# Patient Record
Sex: Female | Born: 1945 | Race: White | Hispanic: No
Health system: Southern US, Community
[De-identification: ages and names within clinical notes are randomized; demographics above are authoritative.]

## PROBLEM LIST (undated history)

## (undated) DIAGNOSIS — F419 Anxiety disorder, unspecified: Secondary | ICD-10-CM

## (undated) DIAGNOSIS — E7800 Pure hypercholesterolemia, unspecified: Secondary | ICD-10-CM

## (undated) DIAGNOSIS — K219 Gastro-esophageal reflux disease without esophagitis: Secondary | ICD-10-CM

---

## 2012-12-06 ENCOUNTER — Emergency Department (HOSPITAL_COMMUNITY): Payer: Medicare Other

## 2012-12-06 ENCOUNTER — Encounter (HOSPITAL_COMMUNITY): Payer: Self-pay | Admitting: Radiology

## 2012-12-06 ENCOUNTER — Emergency Department (HOSPITAL_COMMUNITY)
Admission: EM | Admit: 2012-12-06 | Discharge: 2012-12-07 | Disposition: A | Discharge status: Home / Self Care | Payer: Medicare Other | Attending: Emergency Medicine | Admitting: Emergency Medicine

## 2012-12-06 DIAGNOSIS — Z7982 Long term (current) use of aspirin: Secondary | ICD-10-CM | POA: Insufficient documentation

## 2012-12-06 DIAGNOSIS — R21 Rash and other nonspecific skin eruption: Secondary | ICD-10-CM | POA: Insufficient documentation

## 2012-12-06 DIAGNOSIS — R1110 Vomiting, unspecified: Secondary | ICD-10-CM

## 2012-12-06 DIAGNOSIS — R109 Unspecified abdominal pain: Secondary | ICD-10-CM

## 2012-12-06 DIAGNOSIS — R1084 Generalized abdominal pain: Secondary | ICD-10-CM | POA: Insufficient documentation

## 2012-12-06 DIAGNOSIS — R112 Nausea with vomiting, unspecified: Secondary | ICD-10-CM | POA: Insufficient documentation

## 2012-12-06 DIAGNOSIS — Z79899 Other long term (current) drug therapy: Secondary | ICD-10-CM | POA: Insufficient documentation

## 2012-12-06 LAB — URINALYSIS, ROUTINE W REFLEX MICROSCOPIC
Bilirubin Urine: NEGATIVE
Glucose, UA: NEGATIVE mg/dL
Hgb urine dipstick: NEGATIVE
Ketones, ur: NEGATIVE mg/dL
Nitrite: NEGATIVE
Protein, ur: NEGATIVE mg/dL
Specific Gravity, Urine: 1.018 (ref 1.005–1.030)
Urobilinogen, UA: 0.2 mg/dL (ref 0.0–1.0)
pH: 5 (ref 5.0–8.0)

## 2012-12-06 LAB — COMPREHENSIVE METABOLIC PANEL
AST: 65 U/L — ABNORMAL HIGH (ref 0–37)
Albumin: 4 g/dL (ref 3.5–5.2)
Alkaline Phosphatase: 83 U/L (ref 39–117)
BUN: 18 mg/dL (ref 6–23)
Calcium: 9.3 mg/dL (ref 8.4–10.5)
Chloride: 103 mEq/L (ref 96–112)
GFR calc non Af Amer: 86 mL/min — ABNORMAL LOW (ref >90)
Glucose, Bld: 97 mg/dL (ref 70–99)
Potassium: 3.9 mEq/L (ref 3.5–5.1)
Total Bilirubin: 0.3 mg/dL (ref 0.3–1.2)
Total Protein: 7.1 g/dL (ref 6.0–8.3)

## 2012-12-06 LAB — POCT I-STAT TROPONIN I: Troponin i, poc: 0 ng/mL (ref 0.00–0.08)

## 2012-12-06 LAB — CBC WITH DIFFERENTIAL/PLATELET
Basophils Absolute: 0 10*3/uL (ref 0.0–0.1)
Basophils Relative: 0 % (ref 0–1)
Eosinophils Absolute: 0 10*3/uL (ref 0.0–0.7)
HCT: 39.1 % (ref 36.0–46.0)
Hemoglobin: 13.7 g/dL (ref 12.0–15.0)
Lymphs Abs: 0.6 10*3/uL — ABNORMAL LOW (ref 0.7–4.0)
MCH: 30.4 pg (ref 26.0–34.0)
MCV: 86.9 fL (ref 78.0–100.0)
Neutro Abs: 11.2 10*3/uL — ABNORMAL HIGH (ref 1.7–7.7)
Neutrophils Relative %: 93 % — ABNORMAL HIGH (ref 43–77)
Platelets: 328 10*3/uL (ref 150–400)
RBC: 4.5 MIL/uL (ref 3.87–5.11)
RDW: 12.4 % (ref 11.5–15.5)
WBC: 12.1 10*3/uL — ABNORMAL HIGH (ref 4.0–10.5)

## 2012-12-06 LAB — URINE MICROSCOPIC-ADD ON

## 2012-12-06 LAB — CG4 I-STAT (LACTIC ACID): Lactic Acid, Venous: 2.08 mmol/L (ref 0.5–2.2)

## 2012-12-06 LAB — LIPASE, BLOOD: Lipase: 32 U/L (ref 11–59)

## 2012-12-06 MED ORDER — SODIUM CHLORIDE 0.9 % IV BOLUS (SEPSIS)
1000.0000 mL | Freq: Once | INTRAVENOUS | Status: AC
  Administered 2012-12-06: 1000 mL via INTRAVENOUS

## 2012-12-06 MED ORDER — IOHEXOL 300 MG/ML  SOLN
100.0000 mL | Freq: Once | INTRAMUSCULAR | Status: AC | PRN
  Administered 2012-12-06: 100 mL via INTRAVENOUS

## 2012-12-06 MED ORDER — DIPHENHYDRAMINE HCL 50 MG/ML IJ SOLN
12.5000 mg | Freq: Once | INTRAMUSCULAR | Status: DC
  Filled 2012-12-06: qty 1

## 2012-12-06 MED ORDER — METOCLOPRAMIDE HCL 5 MG/ML IJ SOLN
5.0000 mg | Freq: Once | INTRAMUSCULAR | Status: DC
  Filled 2012-12-06: qty 2

## 2012-12-06 MED ORDER — ONDANSETRON HCL 4 MG/2ML IJ SOLN
4.0000 mg | Freq: Once | INTRAMUSCULAR | Status: AC
  Administered 2012-12-06: 4 mg via INTRAVENOUS
  Filled 2012-12-06: qty 2

## 2012-12-06 MED ORDER — IOHEXOL 300 MG/ML  SOLN
25.0000 mL | INTRAMUSCULAR | Status: AC
  Administered 2012-12-06: 25 mL via ORAL

## 2012-12-06 MED ORDER — FENTANYL CITRATE 0.05 MG/ML IJ SOLN
50.0000 ug | Freq: Once | INTRAMUSCULAR | Status: AC
  Administered 2012-12-06: 50 ug via INTRAVENOUS
  Filled 2012-12-06: qty 2

## 2012-12-06 NOTE — ED Notes (Signed)
Family at bedside. 

## 2012-12-06 NOTE — ED Provider Notes (Signed)
CSN: [id number]     Arrival date & time 12/06/12  2115 History   First MD Initiated Contact with Patient 12/06/12 2208     Chief Complaint  Patient presents with  . Allergic Reaction   (Consider location/radiation/quality/duration/timing/severity/associated sxs/prior Treatment) HPI Onset was today, sudden, constant.  Pt reports seeing PCP earlier today and had a flu shot and cortisone shot. Later in the day she developed nausea, emesis, abdominal pain, rash to face. She is concerned she is having allergic reaction. The pain is moderate, located to abdomen, described as sharp pain. Modifying factors: pain is worse with emesis.  Associated symptoms: no SOB, no cough, mouth feels dry.  Recent medical care: no prior treatments today.   History reviewed. No pertinent past medical history. No past surgical history on file. No family history on file. History  Substance Use Topics  . Smoking status: Not on file  . Smokeless tobacco: Not on file  . Alcohol Use: Not on file   OB History   Grav Para Term Preterm Abortions TAB SAB Ect Mult Living                 Review of Systems Constitutional: Negative for fever.  Eyes: Negative for vision loss.  ENT: Negative for difficulty swallowing.  Cardiovascular: Negative for chest pain. Respiratory: Negative for respiratory distress.  Gastrointestinal:  Positive for vomiting.  Genitourinary: Negative for inability to void.  Musculoskeletal: Negative for gait problem.  Integumentary: Negative for rash.  Neurological: Negative for new focal weakness.     Allergies  Sulfa antibiotics; Influenza vaccines; and Morphine and related  Home Medications   Current Outpatient Rx  Name  Route  Sig  Dispense  Refill  . aspirin 81 MG chewable tablet   Oral   Chew 81 mg by mouth every Monday, Wednesday, and Friday.         . clonazePAM (KLONOPIN) 0.5 MG tablet   Oral   Take 0.5 mg by mouth 2 (two) times daily as needed for anxiety.         Marland Kitchen  omeprazole (PRILOSEC) 40 MG capsule   Oral   Take 40 mg by mouth daily.         . pravastatin (PRAVACHOL) 80 MG tablet   Oral   Take 80 mg by mouth daily.         . Vitamin D, Ergocalciferol, (DRISDOL) 50000 UNITS CAPS capsule   Oral   Take 50,000 Units by mouth every Tuesday.         . cephALEXin (KEFLEX) 500 MG capsule   Oral   Take 1 capsule (500 mg total) by mouth 4 (four) times daily.   20 capsule   0   . ondansetron (ZOFRAN ODT) 4 MG disintegrating tablet   Oral   Take 1 tablet (4 mg total) by mouth every 8 (eight) hours as needed for nausea or vomiting.   20 tablet   0    BP 140/63  Pulse 96  Temp(Src) 98.9 F (37.2 C) (Oral)  Resp 19  SpO2 95% Physical Exam Nursing note and vitals reviewed.  Constitutional: Pt is alert and appears stated age. Eyes: No injection, no scleral icterus. HENT: Atraumatic, airway open without erythema or exudate.  Respiratory: No respiratory distress. Equal breathing bilaterally. Cardiovascular: Normal rate. Extremities warm and well perfused.  Abdomen: Soft, non-distended, tender diffusely.  MSK: Extremities are atraumatic without deformity. Skin: Erythematous petechial rash to face, no wounds.   Neuro: No motor nor sensory  deficit.     ED Course  Procedures (including critical care time) Labs Review Labs Reviewed  CBC WITH DIFFERENTIAL - Abnormal; Notable for the following:    WBC 12.1 (*)    Neutrophils Relative % 93 (*)    Neutro Abs 11.2 (*)    Lymphocytes Relative 5 (*)    Lymphs Abs 0.6 (*)    Monocytes Relative 2 (*)    All other components within normal limits  COMPREHENSIVE METABOLIC PANEL - Abnormal; Notable for the following:    AST 65 (*)    ALT 52 (*)    GFR calc non Af Amer 86 (*)    All other components within normal limits  URINALYSIS, ROUTINE W REFLEX MICROSCOPIC - Abnormal; Notable for the following:    APPearance CLOUDY (*)    Leukocytes, UA LARGE (*)    All other components within normal  limits  URINE CULTURE  LIPASE, BLOOD  URINE MICROSCOPIC-ADD ON  CG4 I-STAT (LACTIC ACID)  POCT I-STAT TROPONIN I   Imaging Review Ct Abdomen Pelvis W Contrast  12/07/2012   CLINICAL DATA:  Abdominal pain.  EXAM: CT ABDOMEN AND PELVIS WITH CONTRAST  TECHNIQUE: Multidetector CT imaging of the abdomen and pelvis was performed using the standard protocol following bolus administration of intravenous contrast.  CONTRAST:  OMNIPAQUE IOHEXOL 300 MG/ML  SOLN  COMPARISON:  None.  FINDINGS: Minimal diffuse low density of the liver relative to the spleen. Normal appearing spleen, pancreas, gallbladder, adrenal glands, kidneys, urinary bladder, uterus and ovaries. Scattered colonic diverticula without evidence of diverticulitis. No evidence of appendicitis. Small sliding hiatal hernia. Minimal dependent atelectasis at both lung bases. Mild lumbar and lower thoracic spine degenerative changes. Atheromatous arterial calcifications.  IMPRESSION: 1. No acute abnormality. 2. Small hiatal hernia. 3. Minimal hepatic steatosis.   Electronically Signed   By: Gordan Payment M.D.   On: 12/07/2012 00:40   Dg Chest Portable 1 View  12/06/2012   CLINICAL DATA:  Shortness of breath.  Vomiting.  EXAM: PORTABLE CHEST - 1 VIEW  COMPARISON:  None.  FINDINGS: Mildly enlarged cardiac silhouette. Clear lungs. Mildly prominent pulmonary vasculature and interstitial markings with mild diffuse peribronchial thickening. Mild scoliosis.  IMPRESSION: Borderline cardiomegaly, mild pulmonary vascular congestion and mild bronchitic changes.   Electronically Signed   By: Gordan Payment M.D.   On: 12/06/2012 22:53    EKG Interpretation     Ventricular Rate:  96 PR Interval:  191 QRS Duration: 95 QT Interval:  331 QTC Calculation: 418 R Axis:   48 Text Interpretation:  Sinus rhythm Normal ECG            MDM   1. Rash of face   2. Abdominal pain   3. Emesis    67 y.o. female presents with abdominal pain, emesis, rash to  face. Not c/w anaphylaxis or allergic reaction. Facial rash c/w small capillary leak from retching. Pt tachycardic. Normal blood pressure. Afebrile. Abdominal pain on exam. EKG without STEMI. CXR without PNA. Lipase w/o pancreatitis. Lactic wnl. CBC with slight leukocytosis. UA with potential infection. Troponin neg. Pt given IV zofran, IV fentanyl and reports feeling better. CT scan ordered to consider acute surgical process and resulted with no acute abnormality. Plan for d/c home with close pcp f/u. Counseling provided regarding diagnosis, treatment plan, follow up recommendations, and return precautions. Questions answered.       I independently viewed, interpreted, and used in my medical decision making all ordered lab and imaging tests.  Medical Decision Making discussed with ED attending Dr. Bebe Shaggy.      Charm Barges, MD 12/07/12 1600

## 2012-12-06 NOTE — ED Notes (Signed)
CG-4 result of 2.08 was shown to resident at 22:22

## 2012-12-06 NOTE — ED Notes (Signed)
Pt states she got a flu shot and cortisone shot. Denies difficulty breathing. Rash on forehead, and face. Nausea, and vomiting. States her tongue feels thick.

## 2012-12-07 MED ORDER — ONDANSETRON 4 MG PO TBDP: 4.0000 mg | ORAL_TABLET | Freq: Three times a day (TID) | ORAL | Status: DC | PRN

## 2012-12-07 MED ORDER — CEPHALEXIN 500 MG PO CAPS: 500.0000 mg | ORAL_CAPSULE | Freq: Four times a day (QID) | ORAL | Status: DC

## 2012-12-07 MED ORDER — ONDANSETRON HCL 4 MG/2ML IJ SOLN
INTRAMUSCULAR | Status: AC
  Filled 2012-12-07: qty 2

## 2012-12-07 MED ORDER — ONDANSETRON HCL 4 MG/2ML IJ SOLN
4.0000 mg | Freq: Once | INTRAMUSCULAR | Status: AC
  Administered 2012-12-07: 4 mg via INTRAVENOUS

## 2012-12-09 LAB — URINE CULTURE

## 2012-12-09 NOTE — Progress Notes (Signed)
ED Antimicrobial Stewardship Positive Culture Follow Up   Natalie Shah is an 67 y.o. female who presented to [facility] on 12/06/2012 with a chief complaint of  Chief Complaint  Patient presents with  . Allergic Reaction    Recent Results (from the past 720 hour(s))  URINE CULTURE     Status: None   Collection Time    12/06/12 10:38 PM      Result Value Range Status   Specimen Description URINE, CLEAN CATCH   Final   Special Requests NONE   Final   Culture  Setup Time     Final   Value: 12/07/2012 04:51     Performed at [facility]   Culture     Final   Value: >=100,000 COLONIES/mL ENTEROCOCCUS SPECIES     Performed at [facility]   Report Status 12/09/2012 FINAL   Final   Organism ID, Bacteria ENTEROCOCCUS SPECIES   Final    [x]  Treated with Keflex, organism resistant to prescribed antimicrobial []  Patient discharged originally without antimicrobial agent and treatment is now indicated  New antibiotic prescription: Amoxicillin 500 mg po BID x 7 days  ED Provider: [name], PA   Elwin Sleight 12/09/2012, 12:00 PM Infectious Diseases Pharmacist Phone# [phone]

## 2012-12-10 ENCOUNTER — Telehealth (HOSPITAL_BASED_OUTPATIENT_CLINIC_OR_DEPARTMENT_OTHER): Payer: Self-pay | Admitting: Emergency Medicine

## 2012-12-10 NOTE — ED Notes (Signed)
Post ED Visit - Positive Culture Follow-up: Successful Patient Follow-Up  Culture assessed and recommendations reviewed by: []  Wes Dulaney, Pharm.D., BCPS []  Celedonio Miyamoto, Pharm.D., BCPS []  Georgina Pillion, [address].D., BCPS []  [place], [address].D., BCPS, AAHIVP []  Estella Husk, Pharm.D., BCPS, AAHIVP [x]  Okey Regal, Pharm.D., BCPS  Positive urine culture  []  Patient discharged without antimicrobial prescription and treatment is now indicated [x]  Organism is resistant to prescribed ED discharge antimicrobial []  Patient with positive blood cultures  Changes discussed with ED provider: [name] PA-C New antibiotic prescription: Amoxicillin 500 mg PO BID x 7 days    Natalie Shah 12/10/2012, 6:35 PM

## 2012-12-10 NOTE — ED Provider Notes (Signed)
I have personally seen and examined the patient.  I have discussed the plan of care with the resident.  I have reviewed the documentation on PMH/FH/Soc. History.  I have reviewed the documentation of the resident and agree.  I have reviewed and agree with the ECG interpretation(s) documented by the resident.  Pt stable in the ED, no distress noted   Joya Gaskins, MD 12/10/12 [id number]

## 2013-02-13 ENCOUNTER — Emergency Department (HOSPITAL_COMMUNITY)
Admission: EM | Admit: 2013-02-13 | Discharge: 2013-02-14 | Disposition: A | Discharge status: Home / Self Care | Payer: Medicare Other | Attending: Emergency Medicine | Admitting: Emergency Medicine

## 2013-02-13 ENCOUNTER — Encounter (HOSPITAL_COMMUNITY): Payer: Self-pay | Admitting: Emergency Medicine

## 2013-02-13 ENCOUNTER — Emergency Department (HOSPITAL_COMMUNITY): Payer: Medicare Other

## 2013-02-13 DIAGNOSIS — Z79899 Other long term (current) drug therapy: Secondary | ICD-10-CM | POA: Insufficient documentation

## 2013-02-13 DIAGNOSIS — J40 Bronchitis, not specified as acute or chronic: Secondary | ICD-10-CM

## 2013-02-13 DIAGNOSIS — Z792 Long term (current) use of antibiotics: Secondary | ICD-10-CM | POA: Insufficient documentation

## 2013-02-13 DIAGNOSIS — E7800 Pure hypercholesterolemia, unspecified: Secondary | ICD-10-CM | POA: Insufficient documentation

## 2013-02-13 DIAGNOSIS — F411 Generalized anxiety disorder: Secondary | ICD-10-CM | POA: Insufficient documentation

## 2013-02-13 DIAGNOSIS — R630 Anorexia: Secondary | ICD-10-CM | POA: Insufficient documentation

## 2013-02-13 DIAGNOSIS — K219 Gastro-esophageal reflux disease without esophagitis: Secondary | ICD-10-CM | POA: Insufficient documentation

## 2013-02-13 HISTORY — DX: Anxiety disorder, unspecified: F41.9

## 2013-02-13 HISTORY — DX: Pure hypercholesterolemia, unspecified: E78.00

## 2013-02-13 HISTORY — DX: Gastro-esophageal reflux disease without esophagitis: K21.9

## 2013-02-13 NOTE — ED Provider Notes (Signed)
CSN: [id number]     Arrival date & time 02/13/13  1554 History   First MD Initiated Contact with Patient 02/13/13 2301     Chief Complaint  Patient presents with  . Cough  . Shortness of Breath   (Consider location/radiation/quality/duration/timing/severity/associated sxs/prior Treatment) HPI Comments: 68 yo female with bronchitis, lipids hx presents with recurrent cough for 3 wks, productive yellow sputum, sick contacts with similar however her cough is lasting longer. No travel, recent hospitalization or recent surgery.  No leg swelling/ pain or blood clot hx.  Non smoker. No lung dz or heart dz.  No cp.    Patient is a 68 y.o. female presenting with cough and shortness of breath. The history is provided by the patient.  Cough Associated symptoms: shortness of breath   Associated symptoms: no chest pain, no chills, no fever, no headaches and no rash   Shortness of Breath Associated symptoms: cough   Associated symptoms: no abdominal pain, no chest pain, no fever, no headaches, no neck pain, no rash and no vomiting     Past Medical History  Diagnosis Date  . High cholesterol   . Anxiety   . Acid reflux    History reviewed. No pertinent past surgical history. History reviewed. No pertinent family history. History  Substance Use Topics  . Smoking status: Not on file  . Smokeless tobacco: Not on file  . Alcohol Use: No   OB History   Grav Para Term Preterm Abortions TAB SAB Ect Mult Living                 Review of Systems  Constitutional: Positive for appetite change. Negative for fever and chills.  HENT: Positive for congestion.   Respiratory: Positive for cough and shortness of breath.   Cardiovascular: Negative for chest pain.  Gastrointestinal: Negative for vomiting and abdominal pain.  Genitourinary: Negative for dysuria and flank pain.  Musculoskeletal: Negative for neck pain and neck stiffness.  Skin: Negative for rash.  Neurological: Negative for  light-headedness and headaches.    Allergies  Sulfa antibiotics; Influenza vaccines; and Morphine and related  Home Medications   Current Outpatient Rx  Name  Route  Sig  Dispense  Refill  . albuterol (PROVENTIL HFA;VENTOLIN HFA) 108 (90 BASE) MCG/ACT inhaler   Inhalation   Inhale 1-2 puffs into the lungs every 6 (six) hours as needed for wheezing or shortness of breath.         Marland Kitchen. albuterol (PROVENTIL) (2.5 MG/3ML) 0.083% nebulizer solution   Nebulization   Take 2.5 mg by nebulization every 6 (six) hours as needed for wheezing or shortness of breath.         Marland Kitchen. aspirin 81 MG chewable tablet   Oral   Chew 81 mg by mouth every Monday, Wednesday, and Friday.         . brompheniramine-pseudoephedrine-DM 30-2-10 MG/5ML syrup   Oral   Take 10 mLs by mouth every 4 (four) hours as needed (for cough).         . clonazePAM (KLONOPIN) 0.5 MG tablet   Oral   Take 0.5 mg by mouth 3 (three) times daily as needed for anxiety.          Marland Kitchen. levofloxacin (LEVAQUIN) 500 MG tablet   Oral   Take 500 mg by mouth daily.         Marland Kitchen. omeprazole (PRILOSEC) 40 MG capsule   Oral   Take 40 mg by mouth daily.         .Marland Kitchen  pravastatin (PRAVACHOL) 40 MG tablet   Oral   Take 40 mg by mouth daily.         . Pseudoeph-Doxylamine-DM-APAP (NYQUIL PO)   Oral   Take 10 mLs by mouth at bedtime as needed (for cough/sleep).         . Vitamin D, Ergocalciferol, (DRISDOL) 50000 UNITS CAPS capsule   Oral   Take 50,000 Units by mouth every Tuesday.         . vitamin E 400 UNIT capsule   Oral   Take 400 Units by mouth daily.          BP 148/84  Pulse 103  Temp(Src) 98.2 F (36.8 C) (Oral)  Resp 20  Wt 157 lb (71.215 kg)  SpO2 97% Physical Exam  Nursing note and vitals reviewed. Constitutional: She is oriented to person, place, and time. She appears well-developed and well-nourished.  HENT:  Head: Normocephalic and atraumatic.  Eyes: Conjunctivae are normal. Right eye exhibits no  discharge. Left eye exhibits no discharge.  Neck: Normal range of motion. Neck supple. No tracheal deviation present.  Cardiovascular: Normal rate and regular rhythm.   Pulmonary/Chest: Effort normal and breath sounds normal.  Recurrent coughing fits in ED during exam, mild congestion  Abdominal: Soft. She exhibits no distension. There is no tenderness. There is no guarding.  Musculoskeletal: She exhibits no edema and no tenderness.  Neurological: She is alert and oriented to person, place, and time.  Skin: Skin is warm. No rash noted.  Psychiatric: She has a normal mood and affect.    ED Course  Procedures (including critical care time) Labs Review Labs Reviewed - No data to display Imaging Review Dg Chest 2 View (if Patient Has Fever And/or Copd)  02/13/2013   CLINICAL DATA:  Bronchitis and shortness of breath.  EXAM: CHEST  2 VIEW  COMPARISON:  12/06/2012  FINDINGS: Two views of the chest demonstrate clear lungs. Heart and mediastinum are within normal limits. Bony thorax is intact with mild degenerative changes. The trachea is midline.  IMPRESSION: No active cardiopulmonary disease.   Electronically Signed   By: Richarda Overlie M.D.   On: 02/13/2013 17:18    EKG Interpretation    Date/Time:  Tuesday February 13 2013 16:13:29 EST Ventricular Rate:  87 PR Interval:  162 QRS Duration: 80 QT Interval:  348 QTC Calculation: 418 R Axis:   39 Text Interpretation:  Normal sinus rhythm Normal ECG Confirmed by Marcoantonio Legault  MD, Caydence Enck ([id number]) on 02/13/2013 11:51:01 PM            MDM   1. Bronchitis    Well appearing.  Vitals okay. Healthy patient, likely recurrent viral URI.  CXR reviewed, no cardiomegaly or infiltrate. Pt has already been on two abx I do not feel another is indicated.   EKG no acute findings, no cp or concern for cardiac clinically.  Results and differential diagnosis were discussed with the patient. Close follow up outpatient was discussed, patient comfortable with  the plan.        Enid Skeens, MD 02/13/13 [id number]

## 2013-02-13 NOTE — ED Notes (Signed)
Pt reports dry cough and fever for 2/12 weeks. Thinks she may have bronchitis. Also reports sinus drainage and right ear pain.

## 2013-02-13 NOTE — ED Notes (Addendum)
Pt reports having a cough and sob for several weeks, has been to pcp and diagnosed with bronchitis, started on steroids and antibiotics. Went to ucc several days ago and given cough meds and levofloxacin, which pt thinks she is allergic to. Having productive cough with white/yellow sputum. Cough noted at triage but airway is intact, speaking in full sentences.

## 2013-02-13 NOTE — Discharge Instructions (Signed)
If you were given medicines take as directed.  If you are on coumadin or contraceptives realize their levels and effectiveness is altered by many different medicines.  If you have any reaction (rash, tongues swelling, other) to the medicines stop taking and see a physician.   °Please follow up as directed and return to the ER or see a physician for new or worsening symptoms.  Thank you. ° ° °

## 2013-02-14 NOTE — ED Notes (Signed)
Patient to followup with PCP in the am

## 2014-11-13 IMAGING — CT CT ABD-PELV W/ CM
2 of 5 series · 17 of 46 positions shown, 19 images · IV contrast (CONTRAST)
Comparison: None.

CLINICAL DATA: Abdominal pain.

EXAM:
CT ABDOMEN AND PELVIS WITH CONTRAST
TECHNIQUE: Multidetector CT imaging of the abdomen and pelvis was performed
using the standard protocol following bolus administration of
intravenous contrast.
CONTRAST:  100mL OMNIPAQUE IOHEXOL 300 MG/ML  SOLN

[Series 2: routine · axial · 0.68mm/px · z∈[-400,-10]mm · 14 of 88 slices shown, 16 images]
[im 5/88  soft-tissue]
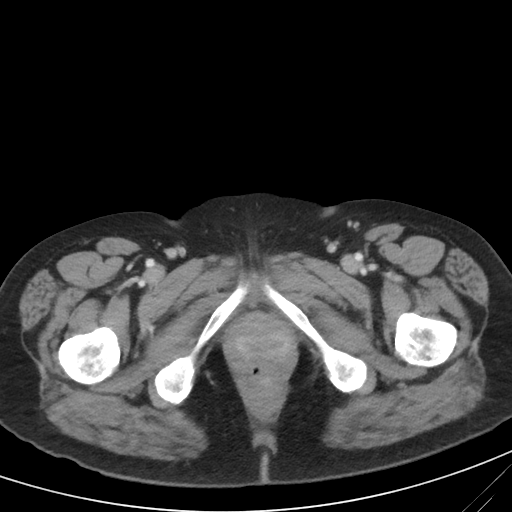
[im 5/88  bone]
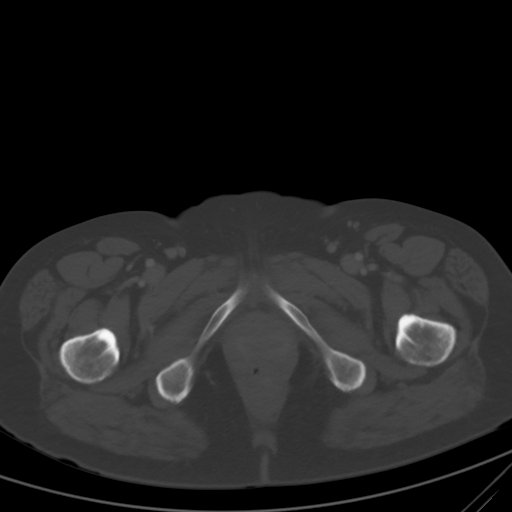
[im 10/88  soft-tissue]
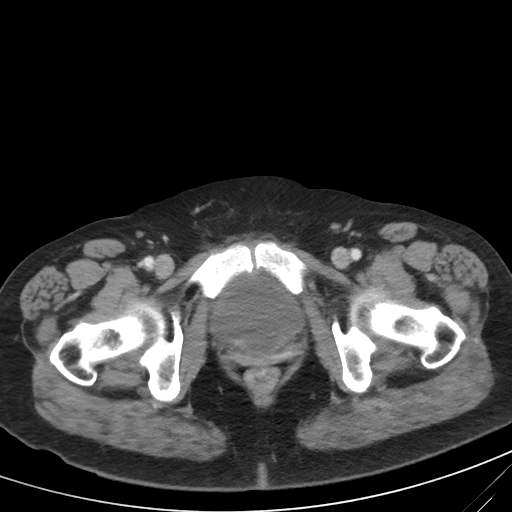
[im 20/88  soft-tissue]
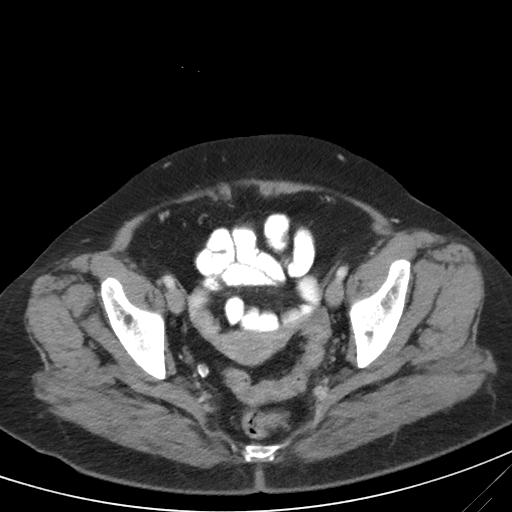
[im 25/88  soft-tissue]
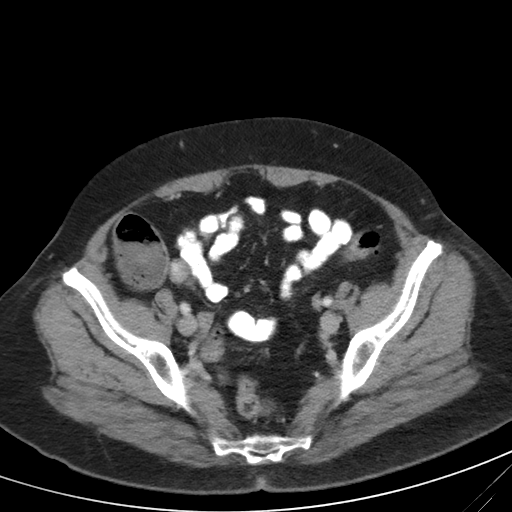
[im 30/88  soft-tissue]
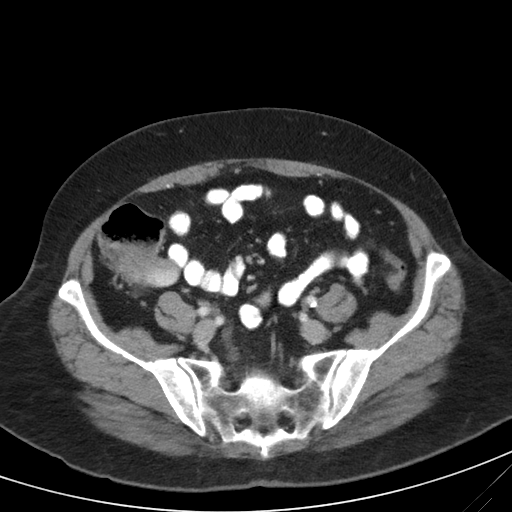
[im 34/88  soft-tissue]
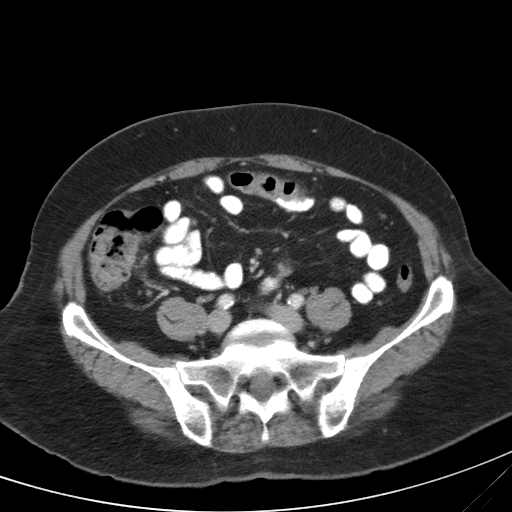
[im 39/88  soft-tissue]
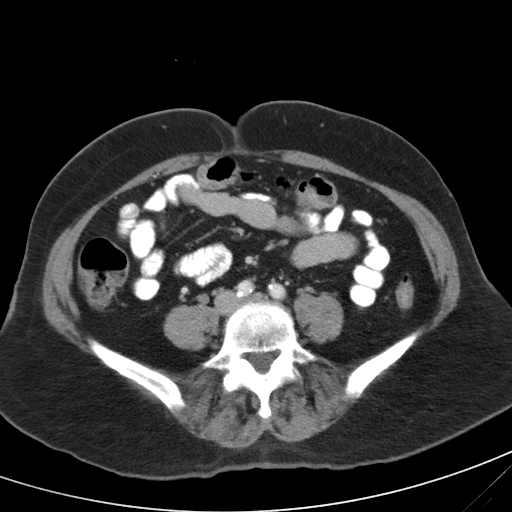
[im 49/88  soft-tissue]
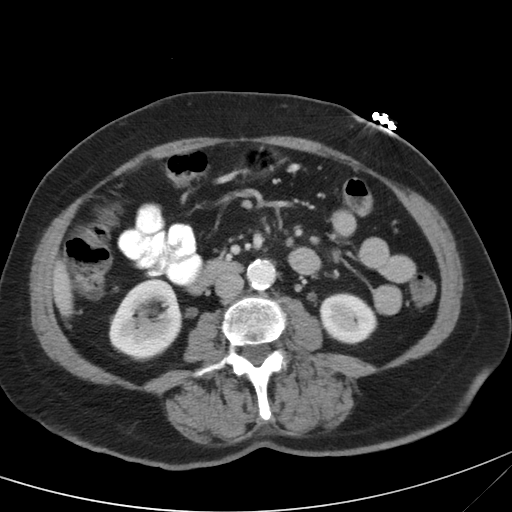
[im 54/88  soft-tissue]
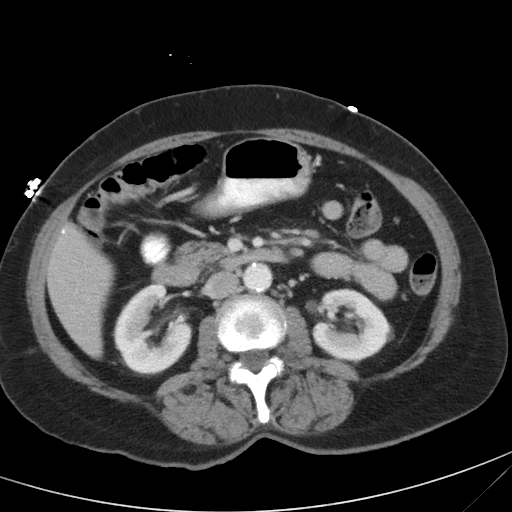
[im 54/88  bone]
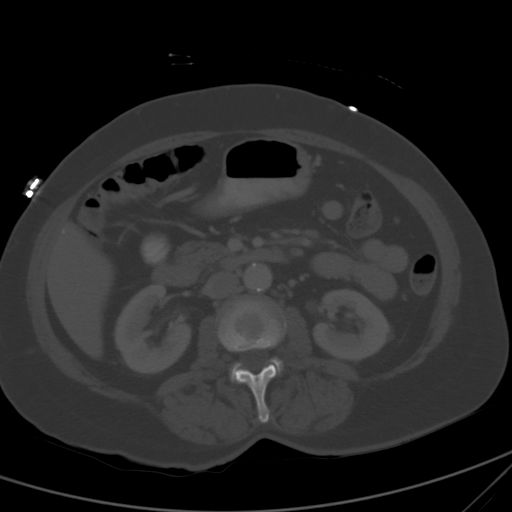
[im 59/88  soft-tissue]
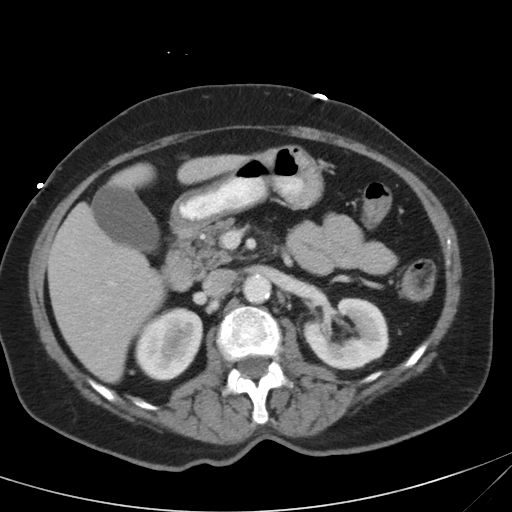
[im 63/88  soft-tissue]
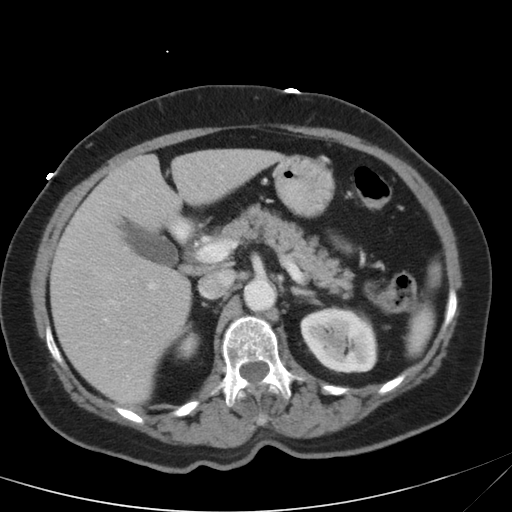
[im 68/88  soft-tissue]
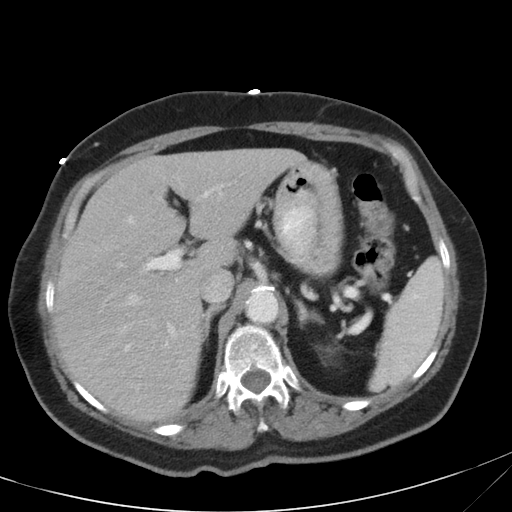
[im 78/88  soft-tissue]
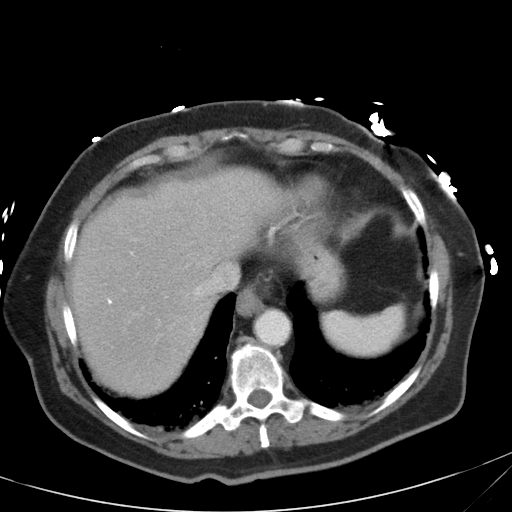
[im 83/88  soft-tissue]
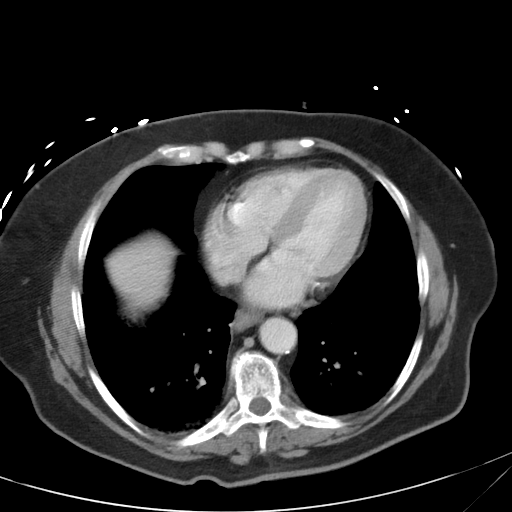

[mpr, coronals, coronal · coronal · 0.85mm/px · 3 of 84 slices shown]
[im 28/84  soft-tissue]
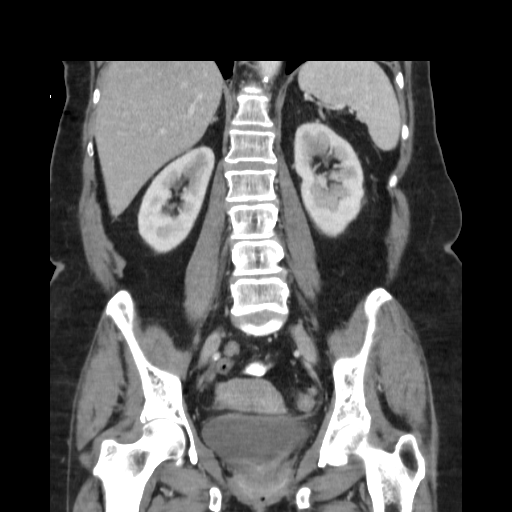
[im 37/84  soft-tissue]
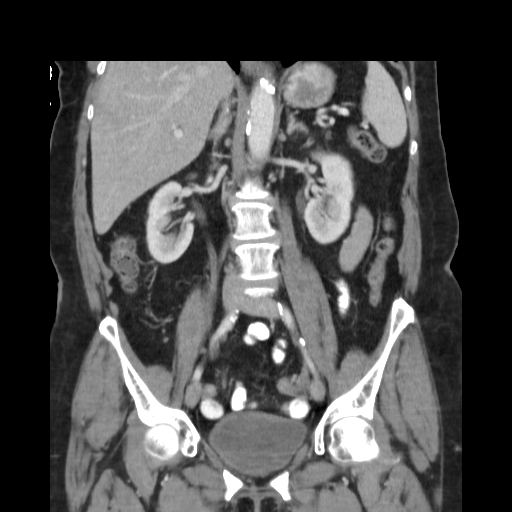
[im 47/84  soft-tissue]
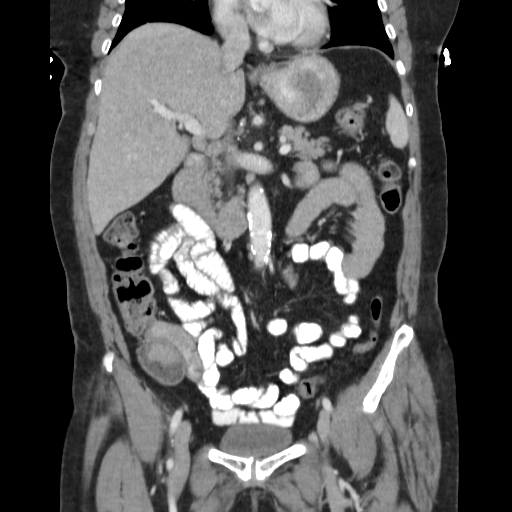

[17 of 46 positions shown; findings below may reference images not displayed]

FINDINGS: Minimal diffuse low density of the liver relative to the spleen.
Normal appearing spleen, pancreas, gallbladder, adrenal glands,
kidneys, urinary bladder, uterus and ovaries. Scattered colonic
diverticula without evidence of diverticulitis. No evidence of
appendicitis. Small sliding hiatal hernia. Minimal dependent
atelectasis at both lung bases. Mild lumbar and lower thoracic spine
degenerative changes. Atheromatous arterial calcifications.
IMPRESSION: 1. No acute abnormality.
2. Small hiatal hernia.
3. Minimal hepatic steatosis.

## 2014-11-13 IMAGING — CR DG CHEST 1V PORT
1 series · 1 of 1 positions shown · non-contrast
Comparison: None.

CLINICAL DATA: Shortness of breath.  Vomiting.

EXAM:
PORTABLE CHEST - 1 VIEW

[AP]
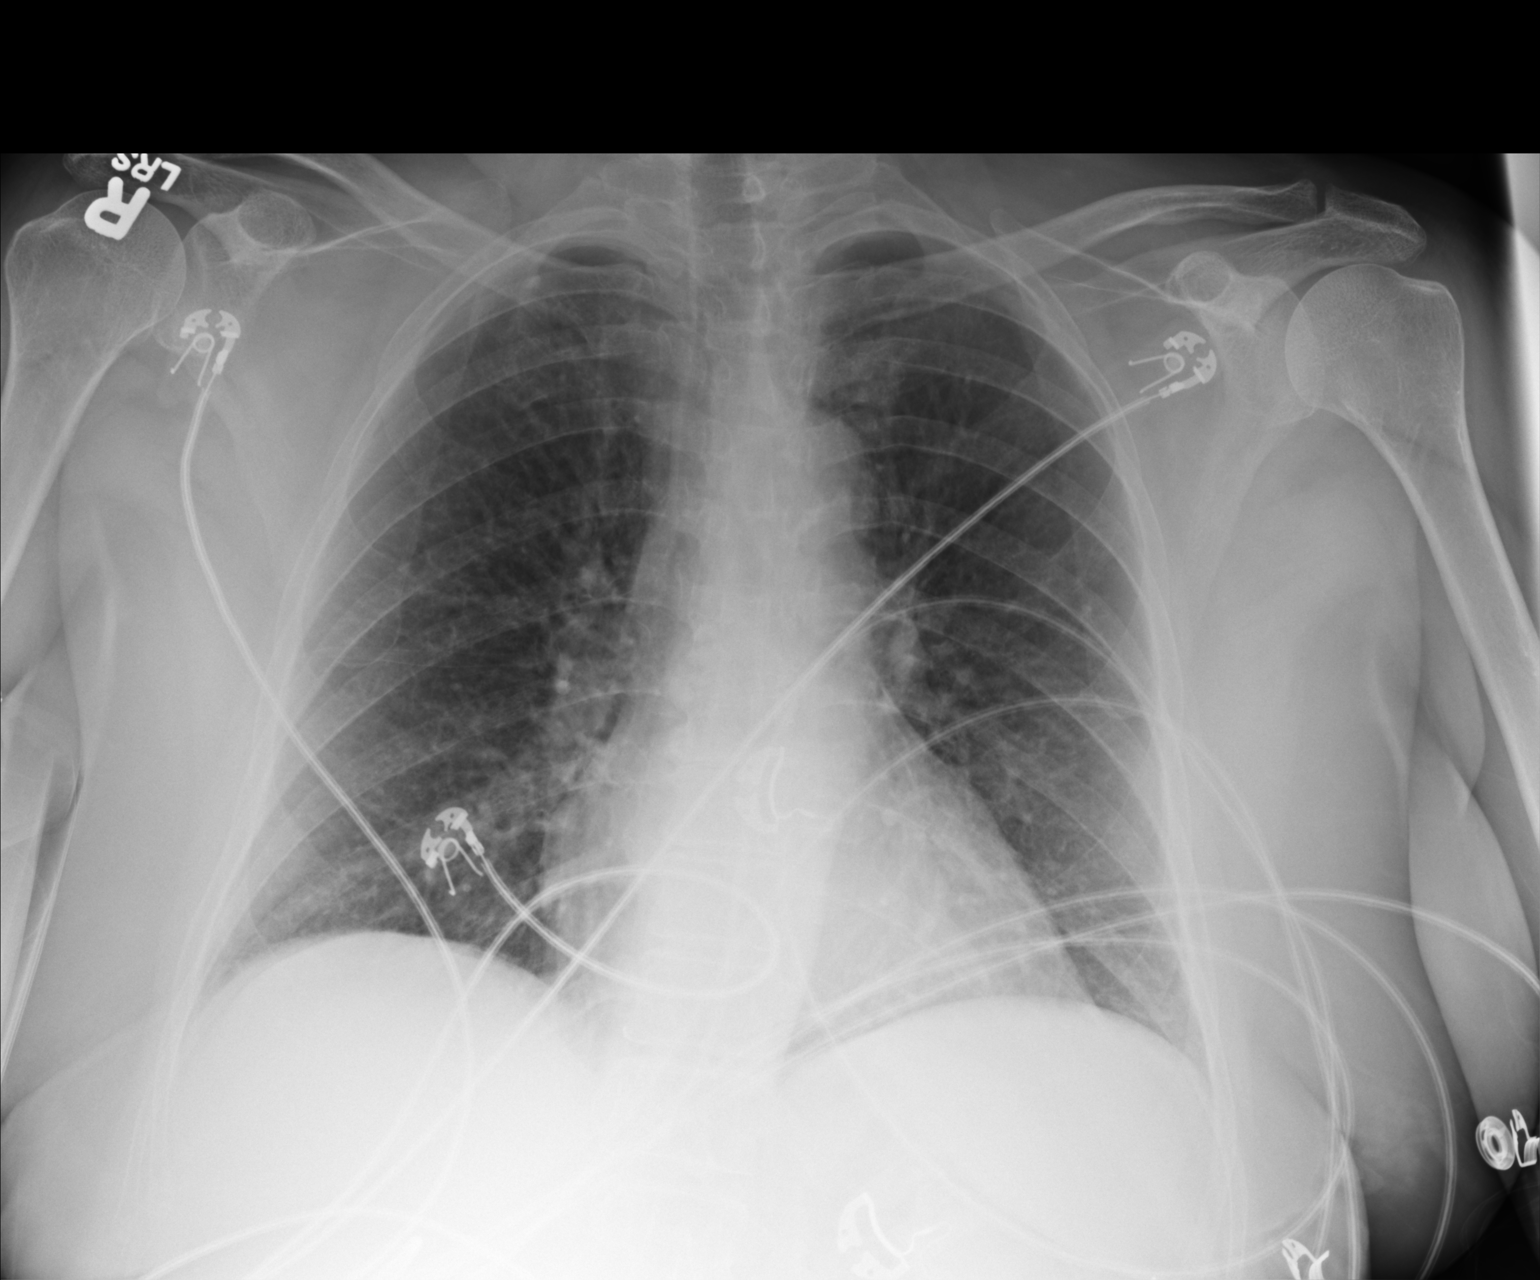

[1 of 1 positions shown; findings below may reference images not displayed]

FINDINGS: Mildly enlarged cardiac silhouette. Clear lungs. Mildly prominent
pulmonary vasculature and interstitial markings with mild diffuse
peribronchial thickening. Mild scoliosis.
IMPRESSION: Borderline cardiomegaly, mild pulmonary vascular congestion and mild
bronchitic changes.
